# Patient Record
Sex: Female | Born: 1990 | Race: Asian | Hispanic: No | Marital: Married | Smoking: Never smoker
Health system: Southern US, Community
[De-identification: ages and names within clinical notes are randomized; demographics above are authoritative.]

## PROBLEM LIST (undated history)

## (undated) DIAGNOSIS — Z789 Other specified health status: Secondary | ICD-10-CM

## (undated) HISTORY — PX: NO PAST SURGERIES: SHX2092

---

## 2018-05-07 ENCOUNTER — Encounter (HOSPITAL_BASED_OUTPATIENT_CLINIC_OR_DEPARTMENT_OTHER): Payer: Self-pay

## 2018-05-07 ENCOUNTER — Emergency Department (HOSPITAL_BASED_OUTPATIENT_CLINIC_OR_DEPARTMENT_OTHER): Payer: 59

## 2018-05-07 ENCOUNTER — Emergency Department (HOSPITAL_BASED_OUTPATIENT_CLINIC_OR_DEPARTMENT_OTHER)
Admission: EM | Admit: 2018-05-07 | Discharge: 2018-05-07 | Disposition: A | Discharge status: Home / Self Care | Payer: 59 | Attending: Emergency Medicine | Admitting: Emergency Medicine

## 2018-05-07 ENCOUNTER — Other Ambulatory Visit: Payer: Self-pay

## 2018-05-07 DIAGNOSIS — R21 Rash and other nonspecific skin eruption: Secondary | ICD-10-CM | POA: Diagnosis not present

## 2018-05-07 DIAGNOSIS — R509 Fever, unspecified: Secondary | ICD-10-CM | POA: Diagnosis not present

## 2018-05-07 LAB — URINALYSIS, ROUTINE W REFLEX MICROSCOPIC
Bilirubin Urine: NEGATIVE
Glucose, UA: NEGATIVE mg/dL
Ketones, ur: 15 mg/dL — AB
Leukocytes, UA: NEGATIVE
Nitrite: NEGATIVE
Protein, ur: 30 mg/dL — AB
SPECIFIC GRAVITY, URINE: 1.02 (ref 1.005–1.030)
pH: 6 (ref 5.0–8.0)

## 2018-05-07 LAB — CBC WITH DIFFERENTIAL/PLATELET
Basophils Absolute: 0 10*3/uL (ref 0.0–0.1)
Basophils Relative: 0 %
Eosinophils Absolute: 0 10*3/uL (ref 0.0–0.7)
Eosinophils Relative: 0 %
HCT: 38.8 % (ref 36.0–46.0)
HEMOGLOBIN: 13.3 g/dL (ref 12.0–15.0)
LYMPHS ABS: 0.8 10*3/uL (ref 0.7–4.0)
LYMPHS PCT: 11 %
MCH: 28.9 pg (ref 26.0–34.0)
MCHC: 34.3 g/dL (ref 30.0–36.0)
MCV: 84.3 fL (ref 78.0–100.0)
Monocytes Absolute: 0.2 10*3/uL (ref 0.1–1.0)
Monocytes Relative: 3 %
NEUTROS ABS: 6.2 10*3/uL (ref 1.7–7.7)
Neutrophils Relative %: 86 %
Platelets: 119 10*3/uL — ABNORMAL LOW (ref 150–400)
RBC: 4.6 MIL/uL (ref 3.87–5.11)
RDW: 11.9 % (ref 11.5–15.5)
WBC: 7.3 10*3/uL (ref 4.0–10.5)

## 2018-05-07 LAB — COMPREHENSIVE METABOLIC PANEL
ALT: 20 U/L (ref 0–44)
AST: 26 U/L (ref 15–41)
Albumin: 3.8 g/dL (ref 3.5–5.0)
Alkaline Phosphatase: 44 U/L (ref 38–126)
Anion gap: 9 (ref 5–15)
BUN: 13 mg/dL (ref 6–20)
CHLORIDE: 100 mmol/L (ref 98–111)
CO2: 22 mmol/L (ref 22–32)
Calcium: 8.3 mg/dL — ABNORMAL LOW (ref 8.9–10.3)
Creatinine, Ser: 1.1 mg/dL — ABNORMAL HIGH (ref 0.44–1.00)
GFR calc Af Amer: >60 mL/min (ref >60)
Glucose, Bld: 126 mg/dL — ABNORMAL HIGH (ref 70–99)
POTASSIUM: 3.5 mmol/L (ref 3.5–5.1)
SODIUM: 131 mmol/L — AB (ref 135–145)
Total Bilirubin: 0.5 mg/dL (ref 0.3–1.2)
Total Protein: 7.3 g/dL (ref 6.5–8.1)

## 2018-05-07 LAB — PREGNANCY, URINE: PREG TEST UR: NEGATIVE

## 2018-05-07 LAB — URINALYSIS, MICROSCOPIC (REFLEX)

## 2018-05-07 LAB — I-STAT CG4 LACTIC ACID, ED: LACTIC ACID, VENOUS: 1.12 mmol/L (ref 0.5–1.9)

## 2018-05-07 MED ORDER — SODIUM CHLORIDE 0.9 % IV BOLUS
1000.0000 mL | Freq: Once | INTRAVENOUS | Status: AC
  Administered 2018-05-07: 1000 mL via INTRAVENOUS

## 2018-05-07 MED ORDER — ACETAMINOPHEN 325 MG PO TABS
650.0000 mg | ORAL_TABLET | Freq: Once | ORAL | Status: AC
  Administered 2018-05-07: 650 mg via ORAL
  Filled 2018-05-07: qty 2

## 2018-05-07 MED ORDER — DOXYCYCLINE HYCLATE 100 MG PO CAPS: 100.0000 mg | ORAL_CAPSULE | Freq: Two times a day (BID) | ORAL | 0 refills | Status: DC

## 2018-05-07 MED ORDER — DOXYCYCLINE HYCLATE 100 MG PO TABS
100.0000 mg | ORAL_TABLET | Freq: Once | ORAL | Status: AC
  Administered 2018-05-07: 100 mg via ORAL
  Filled 2018-05-07: qty 1

## 2018-05-07 NOTE — ED Notes (Signed)
Pt recent travel to [place] x 2 weeks ago

## 2018-05-07 NOTE — ED Notes (Signed)
ED Provider at bedside. 

## 2018-05-07 NOTE — ED Provider Notes (Signed)
[facility] EMERGENCY DEPARTMENT Provider Note   CSN: [id number] Arrival date & time: 05/07/18  1635     History   Chief Complaint Chief Complaint  Patient presents with  . Fever    HPI Carrie Harrington is a 27 y.o. female.  HPI  27 year old female presents with rash and fever.  She has no prior medical problems.  She recently came back from [place] where she was at theme parks.  She does not know of any obvious sick contacts.  Starting 3 days ago she developed some itching which developed into a small rash on her abdomen.  Since then the rash has spread and is now diffuse.  However it is not really present in her hands, feet and is starting to creep into her face.  She was given a cream at [facility] yesterday that she states is helping.  However the itching is gone but the rash is still present.  No oral lesions.  Since 2 nights ago she is been having fever up to 103.  Took Tylenol at about noon today.  No headache, neck pain, cough, chest pain, abdominal pain, vomiting or diarrhea.  Has not been vaccinated.  History reviewed. No pertinent past medical history.  There are no active problems to display for this patient.   History reviewed. No pertinent surgical history.   OB History   None      Home Medications    Prior to Admission medications   Not on File    Family History No family history on file.  Social History Social History   Tobacco Use  . Smoking status: Never Smoker  . Smokeless tobacco: Never Used  Substance Use Topics  . Alcohol use: Never    Frequency: Never  . Drug use: Not on file     Allergies   Patient has no known allergies.   Review of Systems Review of Systems  Constitutional: Positive for fatigue and fever.  HENT: Negative for sore throat.   Respiratory: Negative for cough and shortness of breath.   Cardiovascular: Negative for chest pain.  Gastrointestinal: Positive for nausea. Negative for  abdominal pain, diarrhea and vomiting.  Genitourinary: Negative for dysuria.  Musculoskeletal: Negative for neck pain.  Skin: Positive for rash.  Neurological: Negative for headaches.  All other systems reviewed and are negative.    Physical Exam Updated Vital Signs BP 104/64 (BP Location: Right Arm)   Pulse (!) 132   Temp (!) 101.9 F (38.8 C) (Oral)   Resp (!) 24   Ht 5\' 4"  (1.626 m)   Wt 46.7 kg (103 lb)   LMP 05/02/2018   SpO2 100%   BMI 17.68 kg/m   Physical Exam  Constitutional: She is oriented to person, place, and time. She appears well-developed and well-nourished. No distress.  HENT:  Head: Normocephalic and atraumatic.  Right Ear: External ear normal.  Left Ear: External ear normal.  Nose: Nose normal.  No intra-oral lesions, blisters or koplik spots  Eyes: Right eye exhibits no discharge. Left eye exhibits no discharge.  Neck: Normal range of motion. Neck supple.  Cardiovascular: Regular rhythm and normal heart sounds. Tachycardia present.  Pulmonary/Chest: Effort normal and breath sounds normal.  Abdominal: Soft. There is no tenderness.  Neurological: She is alert and oriented to person, place, and time.  Skin: Skin is warm and dry. Rash noted. Rash is maculopapular. She is not diaphoretic.  Diffuse, blanching maculopapular rash, mostly in the trunk and extremities  save for the hands and feet. Minimal on face  Nursing note and vitals reviewed.    ED Treatments / Results  Labs (all labs ordered are listed, but only abnormal results are displayed) Labs Reviewed  COMPREHENSIVE METABOLIC PANEL - Abnormal; Notable for the following components:      Result Value   Sodium 131 (*)    Glucose, Bld 126 (*)    Creatinine, Ser 1.10 (*)    Calcium 8.3 (*)    All other components within normal limits  CBC WITH DIFFERENTIAL/PLATELET - Abnormal; Notable for the following components:   Platelets 119 (*)    All other components within normal limits  URINALYSIS,  ROUTINE W REFLEX MICROSCOPIC - Abnormal; Notable for the following components:   Hgb urine dipstick TRACE (*)    Ketones, ur 15 (*)    Protein, ur 30 (*)    All other components within normal limits  URINALYSIS, MICROSCOPIC (REFLEX) - Abnormal; Notable for the following components:   Bacteria, UA MANY (*)    All other components within normal limits  CULTURE, BLOOD (ROUTINE X 2)  CULTURE, BLOOD (ROUTINE X 2)  PREGNANCY, URINE  RUBEOLA ANTIBODY IGG  RUBEOLA ANTIBODY, IGM  HIV ANTIBODY (ROUTINE TESTING)  HIV-1 RNA, QUALITATIVE, TMA  ROCKY MTN SPOTTED FVR ABS PNL(IGG+IGM)  I-STAT CG4 LACTIC ACID, ED    EKG None  Radiology Dg Chest Port 1 View  Result Date: 05/07/2018 CLINICAL DATA:  27 y/o  F; fever, fatigue, rash. EXAM: PORTABLE CHEST 1 VIEW COMPARISON:  None. FINDINGS: The heart size and mediastinal contours are within normal limits. Both lungs are clear. The visualized skeletal structures are unremarkable. IMPRESSION: No active disease. Electronically Signed   By: Mitzi Hansen M.D.   On: 05/07/2018 18:37    Procedures Procedures (including critical care time)  Medications Ordered in ED Medications  sodium chloride 0.9 % bolus 1,000 mL (has no administration in time range)  acetaminophen (TYLENOL) tablet 650 mg (has no administration in time range)     Initial Impression / Assessment and Plan / ED Course  I have reviewed the triage vital signs and the nursing notes.  Pertinent labs & imaging results that were available during my care of the patient were reviewed by me and considered in my medical decision making (see chart for details).     Besides the tachycardia and fever, the patient is actually pretty well-appearing.  She has no complaints besides the rash and the fever.  The rash was itchy but is not now.  She has been traveling recently and is not up-to-date on vaccinations.  No obvious other source of infection.  No urinary symptoms.  I discussed with ID  on-call, Dr. Orvan Falconer.  This presentation is atypical for measles given no other symptoms and only the rash and fever.  She does not have typical cough, coryza, and does not have Koplik spots.  He does have a recommend sending measles titers with IgM and IgG.  Also recommends covering for RMSF with doxycycline for 5 days as well as sending HIV antibody and viral load.  Discussed she should stay at home and essentially quarantine herself.  Discussed return precautions but I see no need for her to be admitted as she is otherwise well-appearing, her vital signs have normalized, and she does not appear to have any organ injury.  Final Clinical Impressions(s) / ED Diagnoses   Final diagnoses:  Fever in adult  Rash and nonspecific skin eruption    ED  Discharge Orders    None       Pricilla Loveless, MD 05/08/18 0025

## 2018-05-07 NOTE — ED Notes (Signed)
Pt and pt husband informed to stay at home until test results were given- Pt given several mask to wear when in public and this RN strictly enforced to wear mask until heard otherwise. Pt given discharge instructions and prescription. Pt and pt husband voiced understanding and have no concerns at this time.

## 2018-05-07 NOTE — ED Notes (Signed)
Pt placed on airborne precautions.

## 2018-05-07 NOTE — ED Notes (Signed)
Spoke with Dr. Ilsa IhaSnyder- Infectious Disease to provide timeline of patient recent travel per pt family

## 2018-05-07 NOTE — ED Triage Notes (Addendum)
Pt had a rash that started on her trunk on Sunday and has spread to entire body with fever and fatigue, was seen [facility] yesterday and not given a diagnosis, was told to go to the ED if she got worse, last had 500mg  tylenol at noon, states the rash itches and that her vaccines are up to date

## 2018-05-07 NOTE — ED Notes (Signed)
Spoke with Diannia RuderKara of infection prevention [facility]. Will contact health depart and review chart.

## 2018-05-07 NOTE — ED Notes (Signed)
Carrie Harrington from ID called and stated that the PCR testing is not necessary, just the [facility] and ICM, and pt should stay in her home for the next few days while she is getting better.  She needs to wear a mask if she goes in public.

## 2018-05-07 NOTE — Discharge Instructions (Addendum)
For now, stay in your house and drink plenty of fluids and use ibuprofen and or Tylenol for fever.  If you develop worsening symptoms such as headache, vomiting, shortness of breath, or other new/concerning symptoms and return to the ER or see her primary care doctor at [facility].  Otherwise take the antibiotics prescribed until completion.

## 2018-05-10 LAB — ROCKY MTN SPOTTED FVR ABS PNL(IGG+IGM)
RMSF IgG: NEGATIVE
RMSF IgM: 0.23 index (ref 0.00–0.89)

## 2018-05-10 LAB — HIV ANTIBODY (ROUTINE TESTING W REFLEX): HIV Screen 4th Generation wRfx: NONREACTIVE

## 2018-05-11 LAB — HIV-1 RNA, QUALITATIVE, TMA: HIV-1 RNA, Qualitative, TMA: NEGATIVE

## 2018-05-12 LAB — CULTURE, BLOOD (ROUTINE X 2)
CULTURE: NO GROWTH
Culture: NO GROWTH
Special Requests: ADEQUATE

## 2018-05-13 LAB — RUBEOLA ANTIBODY IGG: Rubeola IgG: <25 AU/mL — ABNORMAL LOW (ref >29.9)

## 2018-05-13 LAB — RUBEOLA ANTIBODY, IGM

## 2019-01-15 IMAGING — DX DG CHEST 1V PORT
1 series · 1 of 1 positions shown · non-contrast
Comparison: None.

CLINICAL DATA: 27 y/o  F; fever, fatigue, rash.

EXAM:
PORTABLE CHEST 1 VIEW

[chest ap]
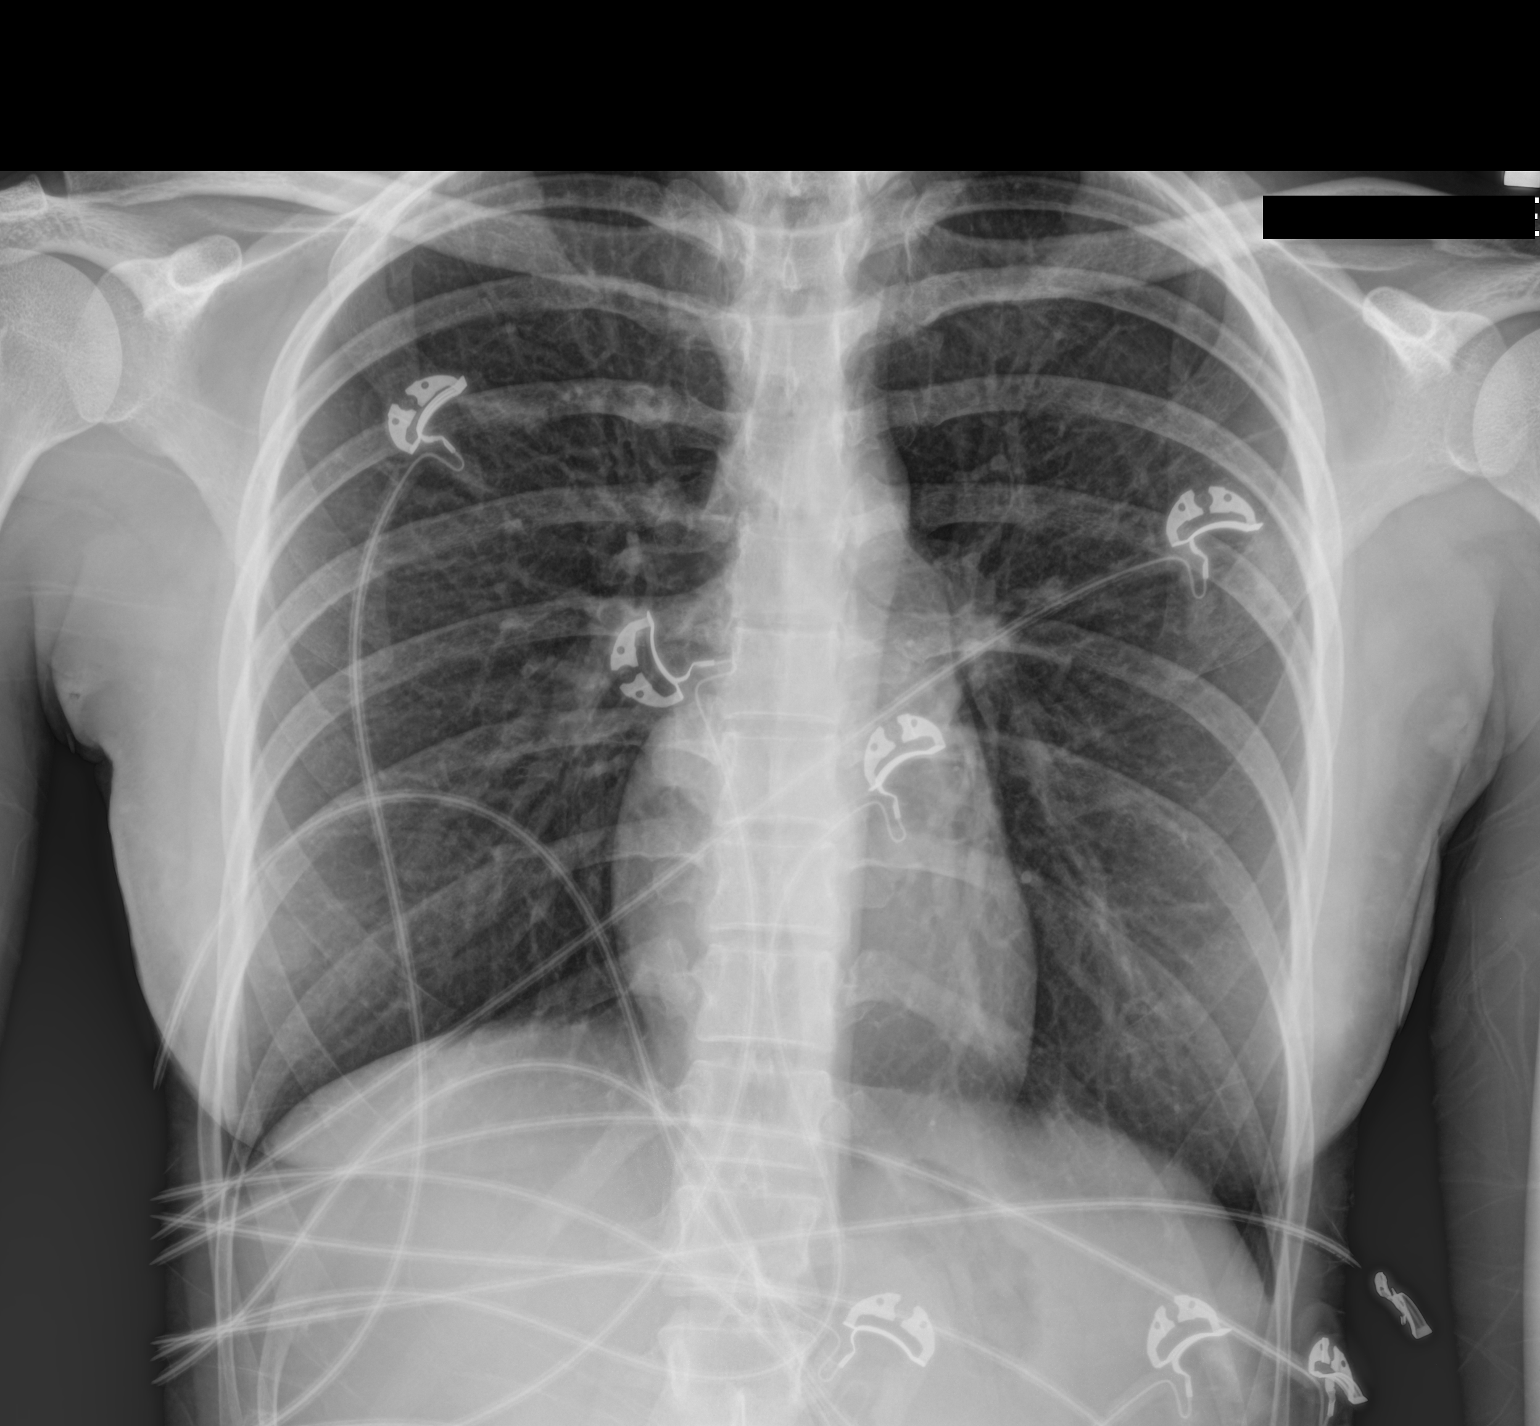

[1 of 1 positions shown; findings below may reference images not displayed]

FINDINGS: The heart size and mediastinal contours are within normal limits.
Both lungs are clear. The visualized skeletal structures are
unremarkable.
IMPRESSION: No active disease.

By: Baraya Bacha M.D.

## 2020-03-16 LAB — OB RESULTS CONSOLE GC/CHLAMYDIA
Chlamydia: NEGATIVE
Gonorrhea: NEGATIVE

## 2020-03-16 LAB — OB RESULTS CONSOLE RUBELLA ANTIBODY, IGM: Rubella: IMMUNE

## 2020-03-16 LAB — OB RESULTS CONSOLE RPR: RPR: NONREACTIVE

## 2020-03-16 LAB — OB RESULTS CONSOLE HEPATITIS B SURFACE ANTIGEN: Hepatitis B Surface Ag: NEGATIVE

## 2020-03-16 LAB — OB RESULTS CONSOLE HIV ANTIBODY (ROUTINE TESTING): HIV: NONREACTIVE

## 2020-09-16 LAB — OB RESULTS CONSOLE GBS: GBS: NEGATIVE

## 2020-10-08 ENCOUNTER — Other Ambulatory Visit: Payer: Self-pay

## 2020-10-08 ENCOUNTER — Inpatient Hospital Stay (EMERGENCY_DEPARTMENT_HOSPITAL)
Admission: AD | Admit: 2020-10-08 | Discharge: 2020-10-08 | Disposition: A | Discharge status: Home / Self Care | Payer: 59 | Source: Home / Self Care | Attending: Obstetrics and Gynecology | Admitting: Obstetrics and Gynecology

## 2020-10-08 ENCOUNTER — Encounter (HOSPITAL_COMMUNITY): Payer: Self-pay | Admitting: Obstetrics and Gynecology

## 2020-10-08 DIAGNOSIS — Z3A39 39 weeks gestation of pregnancy: Secondary | ICD-10-CM | POA: Diagnosis not present

## 2020-10-08 DIAGNOSIS — O471 False labor at or after 37 completed weeks of gestation: Secondary | ICD-10-CM | POA: Insufficient documentation

## 2020-10-08 HISTORY — DX: Other specified health status: Z78.9

## 2020-10-08 NOTE — Discharge Instructions (Signed)
Braxton Hicks Contractions °Contractions of the uterus can occur throughout pregnancy, but they are not always a sign that you are in labor. You may have practice contractions called Braxton Hicks contractions. These false labor contractions are sometimes confused with true labor. °What are Braxton Hicks contractions? °Braxton Hicks contractions are tightening movements that occur in the muscles of the uterus before labor. Unlike true labor contractions, these contractions do not result in opening (dilation) and thinning of the cervix. Toward the end of pregnancy (32-34 weeks), Braxton Hicks contractions can happen more often and may become stronger. These contractions are sometimes difficult to tell apart from true labor because they can be very uncomfortable. You should not feel embarrassed if you go to the hospital with false labor. °Sometimes, the only way to tell if you are in true labor is for your health care provider to look for changes in the cervix. The health care provider will do a physical exam and may monitor your contractions. If you are not in true labor, the exam should show that your cervix is not dilating and your water has not broken. °If there are no other health problems associated with your pregnancy, it is completely safe for you to be sent home with false labor. You may continue to have Braxton Hicks contractions until you go into true labor. °How to tell the difference between true labor and false labor °True labor °· Contractions last 30-70 seconds. °· Contractions become very regular. °· Discomfort is usually felt in the top of the uterus, and it spreads to the lower abdomen and low back. °· Contractions do not go away with walking. °· Contractions usually become more intense and increase in frequency. °· The cervix dilates and gets thinner. °False labor °· Contractions are usually shorter and not as strong as true labor contractions. °· Contractions are usually irregular. °· Contractions  are often felt in the front of the lower abdomen and in the groin. °· Contractions may go away when you walk around or change positions while lying down. °· Contractions get weaker and are shorter-lasting as time goes on. °· The cervix usually does not dilate or become thin. °Follow these instructions at home: ° °· Take over-the-counter and prescription medicines only as told by your health care provider. °· Keep up with your usual exercises and follow other instructions from your health care provider. °· Eat and drink lightly if you think you are going into labor. °· If Braxton Hicks contractions are making you uncomfortable: °? Change your position from lying down or resting to walking, or change from walking to resting. °? Sit and rest in a tub of warm water. °? Drink enough fluid to keep your urine pale yellow. Dehydration may cause these contractions. °? Do slow and deep breathing several times an hour. °· Keep all follow-up prenatal visits as told by your health care provider. This is important. °Contact a health care provider if: °· You have a fever. °· You have continuous pain in your abdomen. °Get help right away if: °· Your contractions become stronger, more regular, and closer together. °· You have fluid leaking or gushing from your vagina. °· You pass blood-tinged mucus (bloody show). °· You have bleeding from your vagina. °· You have low back pain that you never had before. °· You feel your baby’s head pushing down and causing pelvic pressure. °· Your baby is not moving inside you as much as it used to. °Summary °· Contractions that occur before labor are   called Braxton Hicks contractions, false labor, or practice contractions. °· Braxton Hicks contractions are usually shorter, weaker, farther apart, and less regular than true labor contractions. True labor contractions usually become progressively stronger and regular, and they become more frequent. °· Manage discomfort from Braxton Hicks contractions  by changing position, resting in a warm bath, drinking plenty of water, or practicing deep breathing. °This information is not intended to replace advice given to you by your health care provider. Make sure you discuss any questions you have with your health care provider. °Document Revised: 09/27/2017 Document Reviewed: 02/28/2017 °Elsevier Patient Education © 2020 [employer] ° °

## 2020-10-08 NOTE — MAU Note (Signed)
Pt reports to mau with c/o ctx q 10 min since last night.  Pt reports she had her membranes swept yesterday in office.  Denies LOF but reports some bloody show.  +FM

## 2020-10-08 NOTE — MAU Provider Note (Signed)
S: Ms. Gwenyth Dingee is a 29 y.o. G1P0 at [redacted]w[redacted]d  who presents to MAU today complaining contractions q 10 minutes since this morning, but they have eased since she got in the car to come here. She denies vaginal bleeding. She endorses LOF. She reports normal fetal movement.    O: BP 121/85 (BP Location: Right Arm)   Pulse 97   Temp 97.7 F (36.5 C) (Oral)   Resp 16   SpO2 99%  GENERAL: Well-developed, well-nourished female in no acute distress.  HEAD: Normocephalic, atraumatic.  CHEST: Normal effort of breathing, regular heart rate ABDOMEN: Soft, nontender, gravid  Cervical exam (unchanged from her last office visit):  Dilation: 3 Effacement (%): 50 Cervical Position: Posterior Station: Ballotable Presentation: Vertex Exam by:: Shanda Bumps  Fetal Monitoring: reactive Baseline: 140 Variability: moderate Accelerations: present Decelerations: none Contractions: irregular >47min apart  A: SIUP at [redacted]w[redacted]d  False labor  P: Discharge to home in stable condition with term labor precautions Follow up at [facility] as scheduled  Bernerd Limbo, CNM 10/08/2020 5:40 PM

## 2020-10-09 ENCOUNTER — Inpatient Hospital Stay (HOSPITAL_COMMUNITY)
Admission: AD | Admit: 2020-10-09 | Discharge: 2020-10-12 | DRG: 788 | Disposition: A | Discharge status: Home / Self Care | Payer: 59 | Attending: Obstetrics | Admitting: Obstetrics

## 2020-10-09 ENCOUNTER — Other Ambulatory Visit: Payer: Self-pay

## 2020-10-09 ENCOUNTER — Inpatient Hospital Stay (HOSPITAL_COMMUNITY): Payer: 59 | Admitting: Anesthesiology

## 2020-10-09 ENCOUNTER — Encounter (HOSPITAL_COMMUNITY): Payer: Self-pay | Admitting: Obstetrics and Gynecology

## 2020-10-09 DIAGNOSIS — Z3A40 40 weeks gestation of pregnancy: Secondary | ICD-10-CM

## 2020-10-09 DIAGNOSIS — O411230 Chorioamnionitis, third trimester, not applicable or unspecified: Secondary | ICD-10-CM | POA: Diagnosis present

## 2020-10-09 DIAGNOSIS — Z20822 Contact with and (suspected) exposure to covid-19: Secondary | ICD-10-CM | POA: Diagnosis present

## 2020-10-09 DIAGNOSIS — O411290 Chorioamnionitis, unspecified trimester, not applicable or unspecified: Secondary | ICD-10-CM | POA: Diagnosis not present

## 2020-10-09 DIAGNOSIS — O26893 Other specified pregnancy related conditions, third trimester: Secondary | ICD-10-CM | POA: Diagnosis present

## 2020-10-09 LAB — CBC
HCT: 42.5 % (ref 36.0–46.0)
Hemoglobin: 13 g/dL (ref 12.0–15.0)
MCH: 26.7 pg (ref 26.0–34.0)
MCHC: 30.6 g/dL (ref 30.0–36.0)
MCV: 87.3 fL (ref 80.0–100.0)
Platelets: 280 10*3/uL (ref 150–400)
RBC: 4.87 MIL/uL (ref 3.87–5.11)
RDW: 15.9 % — ABNORMAL HIGH (ref 11.5–15.5)
WBC: 13.9 10*3/uL — ABNORMAL HIGH (ref 4.0–10.5)
nRBC: 0 % (ref 0.0–0.2)

## 2020-10-09 LAB — RESP PANEL BY RT-PCR (FLU A&B, COVID) ARPGX2
Influenza A by PCR: NEGATIVE
Influenza B by PCR: NEGATIVE
SARS Coronavirus 2 by RT PCR: NEGATIVE

## 2020-10-09 LAB — TYPE AND SCREEN
ABO/RH(D): B POS
Antibody Screen: NEGATIVE

## 2020-10-09 MED ORDER — SOD CITRATE-CITRIC ACID 500-334 MG/5ML PO SOLN
30.0000 mL | ORAL | Status: DC | PRN
  Administered 2020-10-10: 30 mL via ORAL
  Filled 2020-10-09: qty 15

## 2020-10-09 MED ORDER — ACETAMINOPHEN 325 MG PO TABS: 650.0000 mg | ORAL_TABLET | ORAL | Status: DC | PRN

## 2020-10-09 MED ORDER — FENTANYL-BUPIVACAINE-NACL 0.5-0.125-0.9 MG/250ML-% EP SOLN
12.0000 mL/h | EPIDURAL | Status: DC | PRN
  Filled 2020-10-09: qty 250

## 2020-10-09 MED ORDER — OXYTOCIN-SODIUM CHLORIDE 30-0.9 UT/500ML-% IV SOLN
1.0000 m[IU]/min | INTRAVENOUS | Status: DC
  Administered 2020-10-09: 13:00:00 2 m[IU]/min via INTRAVENOUS
  Filled 2020-10-09: qty 500

## 2020-10-09 MED ORDER — OXYCODONE-ACETAMINOPHEN 5-325 MG PO TABS: 2.0000 | ORAL_TABLET | ORAL | Status: DC | PRN

## 2020-10-09 MED ORDER — DIPHENHYDRAMINE HCL 50 MG/ML IJ SOLN: 12.5000 mg | INTRAMUSCULAR | Status: DC | PRN

## 2020-10-09 MED ORDER — SODIUM CHLORIDE 0.9 % IV SOLN
2.0000 g | Freq: Once | INTRAVENOUS | Status: AC
  Administered 2020-10-09: 23:00:00 2 g via INTRAVENOUS
  Filled 2020-10-09: qty 2000

## 2020-10-09 MED ORDER — TERBUTALINE SULFATE 1 MG/ML IJ SOLN: 0.2500 mg | Freq: Once | INTRAMUSCULAR | Status: DC | PRN

## 2020-10-09 MED ORDER — ACETAMINOPHEN 500 MG PO TABS
1000.0000 mg | ORAL_TABLET | Freq: Once | ORAL | Status: AC
  Administered 2020-10-09: 23:00:00 1000 mg via ORAL
  Filled 2020-10-09: qty 2

## 2020-10-09 MED ORDER — LACTATED RINGERS IV SOLN: 500.0000 mL | Freq: Once | INTRAVENOUS | Status: DC

## 2020-10-09 MED ORDER — PHENYLEPHRINE 40 MCG/ML (10ML) SYRINGE FOR IV PUSH (FOR BLOOD PRESSURE SUPPORT): 80.0000 ug | PREFILLED_SYRINGE | INTRAVENOUS | Status: DC | PRN

## 2020-10-09 MED ORDER — OXYTOCIN-SODIUM CHLORIDE 30-0.9 UT/500ML-% IV SOLN: 2.5000 [IU]/h | INTRAVENOUS | Status: DC

## 2020-10-09 MED ORDER — LACTATED RINGERS IV SOLN
INTRAVENOUS | Status: DC
  Administered 2020-10-09: 125 mL via INTRAVENOUS

## 2020-10-09 MED ORDER — EPHEDRINE 5 MG/ML INJ: 10.0000 mg | INTRAVENOUS | Status: DC | PRN

## 2020-10-09 MED ORDER — SODIUM CHLORIDE (PF) 0.9 % IJ SOLN
INTRAMUSCULAR | Status: DC | PRN
  Administered 2020-10-09: 12 mL/h via EPIDURAL

## 2020-10-09 MED ORDER — LIDOCAINE HCL (PF) 1 % IJ SOLN
INTRAMUSCULAR | Status: DC | PRN
  Administered 2020-10-09: 2 mL via EPIDURAL
  Administered 2020-10-09: 3 mL via EPIDURAL
  Administered 2020-10-09: 5 mL via EPIDURAL

## 2020-10-09 MED ORDER — ONDANSETRON HCL 4 MG/2ML IJ SOLN: 4.0000 mg | Freq: Four times a day (QID) | INTRAMUSCULAR | Status: DC | PRN

## 2020-10-09 MED ORDER — OXYTOCIN BOLUS FROM INFUSION: 333.0000 mL | Freq: Once | INTRAVENOUS | Status: DC

## 2020-10-09 MED ORDER — LIDOCAINE HCL (PF) 1 % IJ SOLN: 30.0000 mL | INTRAMUSCULAR | Status: DC | PRN

## 2020-10-09 MED ORDER — OXYCODONE-ACETAMINOPHEN 5-325 MG PO TABS: 1.0000 | ORAL_TABLET | ORAL | Status: DC | PRN

## 2020-10-09 MED ORDER — LACTATED RINGERS IV SOLN
500.0000 mL | INTRAVENOUS | Status: DC | PRN
  Administered 2020-10-09: 500 mL via INTRAVENOUS

## 2020-10-09 MED ORDER — FLEET ENEMA 7-19 GM/118ML RE ENEM: 1.0000 | ENEMA | RECTAL | Status: DC | PRN

## 2020-10-09 MED ORDER — GENTAMICIN SULFATE 40 MG/ML IJ SOLN
5.0000 mg/kg | Freq: Once | INTRAVENOUS | Status: AC
  Administered 2020-10-09: 340 mg via INTRAVENOUS
  Filled 2020-10-09: qty 8.5

## 2020-10-09 NOTE — Anesthesia Procedure Notes (Signed)
Epidural Patient location during procedure: OB Start time: 10/09/2020 12:23 PM End time: 10/09/2020 12:30 PM  Staffing Anesthesiologist: Cecile Hearing, MD Performed: anesthesiologist   Preanesthetic Checklist Completed: patient identified, IV checked, risks and benefits discussed, monitors and equipment checked, pre-op evaluation and timeout performed  Epidural Patient position: sitting Prep: DuraPrep Patient monitoring: blood pressure and continuous pulse ox Approach: midline Location: L3-L4 Injection technique: LOR air  Needle:  Needle type: Tuohy  Needle gauge: 17 G Needle length: 9 cm Needle insertion depth: 5 cm Catheter size: 19 Gauge Catheter at skin depth: 10 cm Test dose: negative and Other (1% Lidocaine)  Additional Notes Patient identified.  Risk benefits discussed including failed block, incomplete pain control, headache, nerve damage, paralysis, blood pressure changes, nausea, vomiting, reactions to medication both toxic or allergic, and postpartum back pain.  Patient expressed understanding and wished to proceed.  All questions were answered.  Sterile technique used throughout procedure and epidural site dressed with sterile barrier dressing. No paresthesia or other complications noted. The patient did not experience any signs of intravascular injection such as tinnitus or metallic taste in mouth nor signs of intrathecal spread such as rapid motor block. Please see nursing notes for vital signs. Reason for block:procedure for pain

## 2020-10-09 NOTE — Anesthesia Preprocedure Evaluation (Signed)
Anesthesia Evaluation  Patient identified by MRN, date of birth, ID band Patient awake    Reviewed: Allergy & Precautions, NPO status , Patient's Chart, lab work & pertinent test results  Airway Mallampati: II  TM Distance: >3 FB Neck ROM: Full    Dental  (+) Teeth Intact, Dental Advisory Given   Pulmonary neg pulmonary ROS,    Pulmonary exam normal breath sounds clear to auscultation       Cardiovascular negative cardio ROS Normal cardiovascular exam Rhythm:Regular Rate:Normal     Neuro/Psych negative neurological ROS     GI/Hepatic negative GI ROS, Neg liver ROS,   Endo/Other  negative endocrine ROS  Renal/GU negative Renal ROS     Musculoskeletal negative musculoskeletal ROS (+)   Abdominal   Peds  Hematology negative hematology ROS (+) Plt 280k   Anesthesia Other Findings Day of surgery medications reviewed with the patient.  Reproductive/Obstetrics (+) Pregnancy                             Anesthesia Physical Anesthesia Plan  ASA: II  Anesthesia Plan: Epidural   Post-op Pain Management:    Induction:   PONV Risk Score and Plan: 2 and Treatment may vary due to age or medical condition  Airway Management Planned: Natural Airway  Additional Equipment:   Intra-op Plan:   Post-operative Plan:   Informed Consent: I have reviewed the patients History and Physical, chart, labs and discussed the procedure including the risks, benefits and alternatives for the proposed anesthesia with the patient or authorized representative who has indicated his/her understanding and acceptance.       Plan Discussed with:   Anesthesia Plan Comments: (Patient identified. Risks/Benefits/Options discussed with patient including but not limited to bleeding, infection, nerve damage, paralysis, failed block, incomplete pain control, headache, blood pressure changes, nausea, vomiting, reactions to  medication both or allergic, itching and postpartum back pain. Confirmed with bedside nurse the patient's most recent platelet count. Confirmed with patient that they are not currently taking any anticoagulation, have any bleeding history or any family history of bleeding disorders. Patient expressed understanding and wished to proceed. All questions were answered. )        Anesthesia Quick Evaluation

## 2020-10-09 NOTE — H&P (Signed)
OB ADMISSION/ HISTORY & PHYSICAL:  Admission Date: 10/09/2020  9:31 AM  Admit Diagnosis: Normal labor [O80, Z37.9]    Carrie Harrington is a 29 y.o. female presenting for labor contractions. Seen last night in MAU 3cm. Ctx overnight and no sleep, stronger since 6 AM. No LOF/VB, good FM. Denies HA/NV/RUQ pain/visual changes.   Spouse present and supportive.  Expecting baby boy, declines circ.   Prenatal History: G1P0   EDC : 10/09/2020, by Other Basis  Prenatal care at [facility] since 10 wks.   Prenatal course complicated by: Pruritus of soles at 37 wks, bile salts 8.4, AST/ALT 24/33  Prenatal Labs: ABO, Rh:   B pos Antibody: neg Rubella:   imm RPR:   neg HBsAg:   neg HIV:   neg GBS:   neg 1 hr Glucola : 114 Genetic Screening: AFP1 wnl Ultrasound: nl anatomy XY, AGA  Vaccines: TDaP          UTD         Flu             UTD                    COVID-19 UTD    Maternal Diabetes: No Genetic Screening: Normal Maternal Ultrasounds/Referrals: Normal Fetal Ultrasounds or other Referrals:  None Maternal Substance Abuse:  No Significant Maternal Medications:  None Significant Maternal Lab Results:  Group B Strep negative Other Comments:  None  Medical / Surgical History :  Past medical history:  Past Medical History:  Diagnosis Date  . Medical history non-contributory      Past surgical history:  Past Surgical History:  Procedure Laterality Date  . NO PAST SURGERIES       Family History: History reviewed. No pertinent family history.   Social History:  reports that she has never smoked. She has never used smokeless tobacco. She reports that she does not drink alcohol and does not use drugs.   Allergies: Patient has no known allergies.   Current Medications at time of admission:  Medications Prior to Admission  Medication Sig Dispense Refill Last Dose  . ferrous sulfate 325 (65 FE) MG tablet Take 325 mg by mouth every other day.    10/08/2020 at 1000  . Prenatal Vit-Fe Fumarate-FA (PRENATAL VITAMINS PO) Take 1 tablet by mouth daily.   10/08/2020 at 1000  . doxycycline (VIBRAMYCIN) 100 MG capsule Take 1 capsule (100 mg total) by mouth 2 (two) times daily. 10 capsule 0 More than a month at Unknown time     Review of Systems: ROS As noted above Physical Exam: Vital signs and nursing notes reviewed.  Patient Vitals for the past 24 hrs:  BP Temp Temp src Pulse Resp SpO2 Height Weight  10/09/20 1151 117/82 98.2 F (36.8 C) Oral 92 18 -- 5\' 4"  (1.626 m) 68 kg  10/09/20 1000 114/83 97.8 F (36.6 C) Oral (!) 110 16 98 % -- --     General: AAO x 3, NAD, coping well, desires epidural Heart: RRR Lungs:CTAB Abdomen: Gravid, NT Extremities: no edema Genitalia / VE: Dilation: 5 Effacement (%): 100 Cervical Position: Posterior Station: -1 Presentation: Vertex Exam by:: D. Azam Gervasi, CNM  IBOW  FHR: 130 BPM, mod variability, + accels, no decels TOCO: Ctx q 5-8, mod to palp  Labs:   Pending T&S, CBC, RPR  Recent Labs    10/09/20 1110  WBC 13.9*  HGB 13.0  HCT 42.5  PLT 280  Assessment:  29 y.o. G1P0 at [redacted]w[redacted]d  1. Suboptimal ctx and protracted early stage 2. FHR category 1 3. GBS neg 4. Desires epidural, plans to breastfeed 5. Breastfeeding 6. Placenta disposal per patient request  Plan:  1. Admit to BS 2. Routine L&D orders 3. Analgesia/anesthesia PRN  4. Pitocin augmentation after patient comfortable w/ epidural 5. Anticipate NSVB   Dr Billy Coast notified of admission / plan of care   Neta Mends CNM, MSN 10/09/2020, 12:23 PM

## 2020-10-09 NOTE — MAU Note (Signed)
Pt reports to mau with c/o ctx q 3 min since last night.  Denies LOF.

## 2020-10-09 NOTE — Progress Notes (Signed)
S: Doing well, no complaints, pain well controlled with epidural  O: BP 107/63   Pulse 98   Temp 98.1 F (36.7 C) (Oral)   Resp 18   Ht 5\' 4"  (1.626 m)   Wt 68 kg   SpO2 98%   BMI 25.75 kg/m    FHT:  FHR: 140s bpm, variability: moderate,  accelerations:  Present,  decelerations:  Absent UC:   regular, every 2-4 minutes, pit at 12 munits/ min SVE:   Dilation: 5 Effacement (%): 100 Station: -1 Exam by:: [email or web address], CNM  Repeat exam now by me: 8/90%/vtx 0 AROM clear with bloody mucous  A / P:  29 y.o.  OB History  Gravida Para Term Preterm AB Living  1 0 0 0 0 0  SAB IAB Ectopic Multiple Live Births  0 0 0 0 0   at [redacted]w[redacted]d Augmentation of labor, progressing well  Fetal Wellbeing:  Category I Pain Control:  Epidural  Anticipated MOD:  NSVD  [redacted]w[redacted]d 10/09/2020, 6:44 PM

## 2020-10-10 ENCOUNTER — Encounter (HOSPITAL_COMMUNITY): Payer: Self-pay | Admitting: Obstetrics and Gynecology

## 2020-10-10 ENCOUNTER — Encounter (HOSPITAL_COMMUNITY)
Admission: AD | Disposition: A | Discharge status: Home / Self Care | Payer: Self-pay | Source: Home / Self Care | Attending: Obstetrics

## 2020-10-10 DIAGNOSIS — O411290 Chorioamnionitis, unspecified trimester, not applicable or unspecified: Secondary | ICD-10-CM | POA: Diagnosis not present

## 2020-10-10 LAB — CBC
HCT: 34.2 % — ABNORMAL LOW (ref 36.0–46.0)
Hemoglobin: 10.8 g/dL — ABNORMAL LOW (ref 12.0–15.0)
MCH: 26.8 pg (ref 26.0–34.0)
MCHC: 31.6 g/dL (ref 30.0–36.0)
MCV: 84.9 fL (ref 80.0–100.0)
Platelets: 218 10*3/uL (ref 150–400)
RBC: 4.03 MIL/uL (ref 3.87–5.11)
RDW: 15.8 % — ABNORMAL HIGH (ref 11.5–15.5)
WBC: 20.8 10*3/uL — ABNORMAL HIGH (ref 4.0–10.5)
nRBC: 0 % (ref 0.0–0.2)

## 2020-10-10 LAB — RPR: RPR Ser Ql: NONREACTIVE

## 2020-10-10 SURGERY — Surgical Case
Anesthesia: Epidural

## 2020-10-10 MED ORDER — DIBUCAINE (PERIANAL) 1 % EX OINT: 1.0000 "application " | TOPICAL_OINTMENT | CUTANEOUS | Status: DC | PRN

## 2020-10-10 MED ORDER — DIPHENHYDRAMINE HCL 50 MG/ML IJ SOLN: 12.5000 mg | INTRAMUSCULAR | Status: DC | PRN

## 2020-10-10 MED ORDER — COCONUT OIL OIL: 1.0000 "application " | TOPICAL_OIL | Status: DC | PRN

## 2020-10-10 MED ORDER — ONDANSETRON HCL 4 MG/2ML IJ SOLN
INTRAMUSCULAR | Status: AC
  Filled 2020-10-10: qty 2

## 2020-10-10 MED ORDER — DIPHENHYDRAMINE HCL 25 MG PO CAPS: 25.0000 mg | ORAL_CAPSULE | Freq: Four times a day (QID) | ORAL | Status: DC | PRN

## 2020-10-10 MED ORDER — SODIUM CHLORIDE 0.9% FLUSH: 3.0000 mL | INTRAVENOUS | Status: DC | PRN

## 2020-10-10 MED ORDER — WITCH HAZEL-GLYCERIN EX PADS
1.0000 "application " | MEDICATED_PAD | CUTANEOUS | Status: DC | PRN
  Filled 2020-10-10: qty 100

## 2020-10-10 MED ORDER — TETANUS-DIPHTH-ACELL PERTUSSIS 5-2.5-18.5 LF-MCG/0.5 IM SUSY: 0.5000 mL | PREFILLED_SYRINGE | Freq: Once | INTRAMUSCULAR | Status: DC

## 2020-10-10 MED ORDER — OXYTOCIN-SODIUM CHLORIDE 30-0.9 UT/500ML-% IV SOLN
INTRAVENOUS | Status: AC
  Filled 2020-10-10: qty 500

## 2020-10-10 MED ORDER — SCOPOLAMINE 1 MG/3DAYS TD PT72: 1.0000 | MEDICATED_PATCH | Freq: Once | TRANSDERMAL | Status: DC

## 2020-10-10 MED ORDER — NALBUPHINE HCL 10 MG/ML IJ SOLN: 5.0000 mg | Freq: Once | INTRAMUSCULAR | Status: DC | PRN

## 2020-10-10 MED ORDER — NALOXONE HCL 4 MG/10ML IJ SOLN
1.0000 ug/kg/h | INTRAVENOUS | Status: DC | PRN
  Filled 2020-10-10: qty 5

## 2020-10-10 MED ORDER — HYDROMORPHONE HCL 1 MG/ML IJ SOLN: 0.2500 mg | INTRAMUSCULAR | Status: DC | PRN

## 2020-10-10 MED ORDER — NALBUPHINE HCL 10 MG/ML IJ SOLN: 5.0000 mg | INTRAMUSCULAR | Status: DC | PRN

## 2020-10-10 MED ORDER — MEPERIDINE HCL 25 MG/ML IJ SOLN: 6.2500 mg | INTRAMUSCULAR | Status: DC | PRN

## 2020-10-10 MED ORDER — OXYTOCIN-SODIUM CHLORIDE 30-0.9 UT/500ML-% IV SOLN
2.5000 [IU]/h | INTRAVENOUS | Status: AC
  Administered 2020-10-10: 05:00:00 2.5 [IU]/h via INTRAVENOUS
  Filled 2020-10-10: qty 500

## 2020-10-10 MED ORDER — PRENATAL MULTIVITAMIN CH
1.0000 | ORAL_TABLET | Freq: Every day | ORAL | Status: DC
  Administered 2020-10-10 – 2020-10-12 (×3): 1 via ORAL
  Filled 2020-10-10 (×3): qty 1

## 2020-10-10 MED ORDER — NALOXONE HCL 0.4 MG/ML IJ SOLN: 0.4000 mg | INTRAMUSCULAR | Status: DC | PRN

## 2020-10-10 MED ORDER — ACETAMINOPHEN 500 MG PO TABS: 1000.0000 mg | ORAL_TABLET | Freq: Four times a day (QID) | ORAL | Status: DC

## 2020-10-10 MED ORDER — MEPERIDINE HCL 25 MG/ML IJ SOLN
6.2500 mg | INTRAMUSCULAR | Status: DC | PRN
Start: 2020-10-10 — End: 2020-10-10

## 2020-10-10 MED ORDER — DIPHENHYDRAMINE HCL 25 MG PO CAPS: 25.0000 mg | ORAL_CAPSULE | ORAL | Status: DC | PRN

## 2020-10-10 MED ORDER — IBUPROFEN 800 MG PO TABS
800.0000 mg | ORAL_TABLET | Freq: Three times a day (TID) | ORAL | Status: DC
  Administered 2020-10-10 – 2020-10-12 (×6): 800 mg via ORAL
  Filled 2020-10-10 (×6): qty 1

## 2020-10-10 MED ORDER — ZOLPIDEM TARTRATE 5 MG PO TABS: 5.0000 mg | ORAL_TABLET | Freq: Every evening | ORAL | Status: DC | PRN

## 2020-10-10 MED ORDER — GENTAMICIN SULFATE 40 MG/ML IJ SOLN: 1.5000 mg/kg | Freq: Three times a day (TID) | INTRAVENOUS | Status: DC

## 2020-10-10 MED ORDER — SIMETHICONE 80 MG PO CHEW
80.0000 mg | CHEWABLE_TABLET | Freq: Three times a day (TID) | ORAL | Status: DC
  Administered 2020-10-10 – 2020-10-12 (×7): 80 mg via ORAL
  Filled 2020-10-10 (×7): qty 1

## 2020-10-10 MED ORDER — ACETAMINOPHEN 500 MG PO TABS
1000.0000 mg | ORAL_TABLET | Freq: Four times a day (QID) | ORAL | Status: DC
  Administered 2020-10-10 – 2020-10-12 (×9): 1000 mg via ORAL
  Filled 2020-10-10 (×10): qty 2

## 2020-10-10 MED ORDER — LACTATED RINGERS IV SOLN: INTRAVENOUS | Status: DC | PRN

## 2020-10-10 MED ORDER — KETOROLAC TROMETHAMINE 30 MG/ML IJ SOLN: 30.0000 mg | Freq: Once | INTRAMUSCULAR | Status: DC | PRN

## 2020-10-10 MED ORDER — NALBUPHINE HCL 10 MG/ML IJ SOLN
5.0000 mg | INTRAMUSCULAR | Status: DC | PRN
Start: 2020-10-10 — End: 2020-10-12

## 2020-10-10 MED ORDER — DEXAMETHASONE SODIUM PHOSPHATE 4 MG/ML IJ SOLN
INTRAMUSCULAR | Status: DC | PRN
  Administered 2020-10-10: 4 mg via INTRAVENOUS

## 2020-10-10 MED ORDER — KETOROLAC TROMETHAMINE 30 MG/ML IJ SOLN: 30.0000 mg | Freq: Four times a day (QID) | INTRAMUSCULAR | Status: AC | PRN

## 2020-10-10 MED ORDER — SIMETHICONE 80 MG PO CHEW: 80.0000 mg | CHEWABLE_TABLET | ORAL | Status: DC | PRN

## 2020-10-10 MED ORDER — DEXAMETHASONE SODIUM PHOSPHATE 4 MG/ML IJ SOLN
INTRAMUSCULAR | Status: AC
  Filled 2020-10-10: qty 1

## 2020-10-10 MED ORDER — OXYCODONE HCL 5 MG PO TABS: 5.0000 mg | ORAL_TABLET | ORAL | Status: DC | PRN

## 2020-10-10 MED ORDER — PROMETHAZINE HCL 25 MG/ML IJ SOLN
6.2500 mg | INTRAMUSCULAR | Status: DC | PRN
Start: 2020-10-10 — End: 2020-10-10

## 2020-10-10 MED ORDER — MENTHOL 3 MG MT LOZG: 1.0000 | LOZENGE | OROMUCOSAL | Status: DC | PRN

## 2020-10-10 MED ORDER — MEPERIDINE HCL 25 MG/ML IJ SOLN
INTRAMUSCULAR | Status: AC
  Filled 2020-10-10: qty 1

## 2020-10-10 MED ORDER — OXYTOCIN-SODIUM CHLORIDE 30-0.9 UT/500ML-% IV SOLN
INTRAVENOUS | Status: DC | PRN
  Administered 2020-10-10: 400 mL via INTRAVENOUS

## 2020-10-10 MED ORDER — ONDANSETRON HCL 4 MG/2ML IJ SOLN
INTRAMUSCULAR | Status: DC | PRN
  Administered 2020-10-10: 4 mg via INTRAVENOUS

## 2020-10-10 MED ORDER — SODIUM CHLORIDE 0.9 % IR SOLN
Status: DC | PRN
  Administered 2020-10-10: 1000 mL

## 2020-10-10 MED ORDER — ONDANSETRON HCL 4 MG/2ML IJ SOLN: 4.0000 mg | Freq: Three times a day (TID) | INTRAMUSCULAR | Status: DC | PRN

## 2020-10-10 MED ORDER — SENNOSIDES-DOCUSATE SODIUM 8.6-50 MG PO TABS
2.0000 | ORAL_TABLET | ORAL | Status: DC
  Administered 2020-10-10 – 2020-10-12 (×3): 2 via ORAL
  Filled 2020-10-10 (×3): qty 2

## 2020-10-10 MED ORDER — LIDOCAINE-EPINEPHRINE (PF) 2 %-1:200000 IJ SOLN
INTRAMUSCULAR | Status: DC | PRN
  Administered 2020-10-10: 2 mL via EPIDURAL
  Administered 2020-10-10: 5 mL via EPIDURAL
  Administered 2020-10-10: 3 mL via EPIDURAL

## 2020-10-10 MED ORDER — MORPHINE SULFATE (PF) 0.5 MG/ML IJ SOLN
INTRAMUSCULAR | Status: AC
  Filled 2020-10-10: qty 10

## 2020-10-10 MED ORDER — MEPERIDINE HCL 25 MG/ML IJ SOLN
INTRAMUSCULAR | Status: DC | PRN
  Administered 2020-10-10: 12.5 mg via INTRAVENOUS

## 2020-10-10 MED ORDER — MORPHINE SULFATE (PF) 0.5 MG/ML IJ SOLN
INTRAMUSCULAR | Status: DC | PRN
  Administered 2020-10-10: 3 mg via EPIDURAL

## 2020-10-10 MED ORDER — LIDOCAINE HCL (PF) 2 % IJ SOLN
INTRAMUSCULAR | Status: AC
  Filled 2020-10-10: qty 10

## 2020-10-10 MED ORDER — CLINDAMYCIN PHOSPHATE 900 MG/50ML IV SOLN
900.0000 mg | Freq: Three times a day (TID) | INTRAVENOUS | Status: AC
  Administered 2020-10-10 – 2020-10-11 (×3): 900 mg via INTRAVENOUS
  Filled 2020-10-10 (×4): qty 50

## 2020-10-10 MED ORDER — SIMETHICONE 80 MG PO CHEW: 80.0000 mg | CHEWABLE_TABLET | ORAL | Status: DC

## 2020-10-10 MED ORDER — STERILE WATER FOR IRRIGATION IR SOLN
Status: DC | PRN
  Administered 2020-10-10: 1000 mL

## 2020-10-10 SURGICAL SUPPLY — 36 items
BENZOIN TINCTURE PRP APPL 2/3 (GAUZE/BANDAGES/DRESSINGS) ×3 IMPLANT
CHLORAPREP W/TINT 26ML (MISCELLANEOUS) ×3 IMPLANT
CLAMP CORD UMBIL (MISCELLANEOUS) IMPLANT
CLOSURE WOUND 1/2 X4 (GAUZE/BANDAGES/DRESSINGS) ×1
CLOTH BEACON ORANGE TIMEOUT ST (SAFETY) ×3 IMPLANT
DRSG OPSITE POSTOP 4X10 (GAUZE/BANDAGES/DRESSINGS) ×3 IMPLANT
ELECT REM PT RETURN 9FT ADLT (ELECTROSURGICAL) ×3
ELECTRODE REM PT RTRN 9FT ADLT (ELECTROSURGICAL) ×1 IMPLANT
EXTRACTOR VACUUM M CUP 4 TUBE (SUCTIONS) IMPLANT
EXTRACTOR VACUUM M CUP 4' TUBE (SUCTIONS)
GLOVE BIO SURGEON STRL SZ 6.5 (GLOVE) ×2 IMPLANT
GLOVE BIO SURGEONS STRL SZ 6.5 (GLOVE) ×1
GLOVE BIOGEL PI IND STRL 7.0 (GLOVE) ×2 IMPLANT
GLOVE BIOGEL PI INDICATOR 7.0 (GLOVE) ×4
GOWN STRL REUS W/TWL LRG LVL3 (GOWN DISPOSABLE) ×6 IMPLANT
HEMOSTAT ARISTA ABSORB 3G PWDR (HEMOSTASIS) ×3 IMPLANT
KIT ABG SYR 3ML LUER SLIP (SYRINGE) IMPLANT
NEEDLE HYPO 22GX1.5 SAFETY (NEEDLE) IMPLANT
NEEDLE HYPO 25X5/8 SAFETYGLIDE (NEEDLE) IMPLANT
NS IRRIG 1000ML POUR BTL (IV SOLUTION) ×3 IMPLANT
PACK C SECTION WH (CUSTOM PROCEDURE TRAY) ×3 IMPLANT
PAD OB MATERNITY 4.3X12.25 (PERSONAL CARE ITEMS) ×3 IMPLANT
PENCIL SMOKE EVAC W/HOLSTER (ELECTROSURGICAL) ×3 IMPLANT
STRIP CLOSURE SKIN 1/2X4 (GAUZE/BANDAGES/DRESSINGS) ×2 IMPLANT
SUT MON AB 4-0 PS1 27 (SUTURE) ×3 IMPLANT
SUT PLAIN 0 NONE (SUTURE) IMPLANT
SUT PLAIN 2 0 XLH (SUTURE) IMPLANT
SUT VIC AB 0 CT1 36 (SUTURE) ×6 IMPLANT
SUT VIC AB 0 CTX 36 (SUTURE) ×4
SUT VIC AB 0 CTX36XBRD ANBCTRL (SUTURE) ×2 IMPLANT
SUT VIC AB 2-0 CT1 27 (SUTURE) ×2
SUT VIC AB 2-0 CT1 TAPERPNT 27 (SUTURE) ×1 IMPLANT
SYR CONTROL 10ML LL (SYRINGE) IMPLANT
TOWEL OR 17X24 6PK STRL BLUE (TOWEL DISPOSABLE) ×3 IMPLANT
TRAY FOLEY W/BAG SLVR 14FR LF (SET/KITS/TRAYS/PACK) IMPLANT
WATER STERILE IRR 1000ML POUR (IV SOLUTION) ×3 IMPLANT

## 2020-10-10 NOTE — Transfer of Care (Signed)
Immediate Anesthesia Transfer of Care Note  Patient: Carrie Harrington  Procedure(s) Performed: CESAREAN SECTION (N/A )  Patient Location: PACU  Anesthesia Type:Epidural  Level of Consciousness: awake, alert  and patient cooperative  Airway & Oxygen Therapy: Patient Spontanous Breathing  Post-op Assessment: Report given to RN and Post -op Vital signs reviewed and stable  Post vital signs: Reviewed and stable  Last Vitals:  Vitals Value Taken Time  BP 118/72 10/10/20 0233  Temp    Pulse 95 10/10/20 0237  Resp 19 10/10/20 0237  SpO2 100 % 10/10/20 0237  Vitals shown include unvalidated device data.  Last Pain:  Vitals:   10/10/20 0002  TempSrc: Oral  PainSc:          Complications: No complications documented.

## 2020-10-10 NOTE — Anesthesia Postprocedure Evaluation (Signed)
Anesthesia Post Note  Patient: Carrie Harrington  Procedure(s) Performed: CESAREAN SECTION (N/A )     Patient location during evaluation: PACU Anesthesia Type: Epidural Level of consciousness: awake Pain management: pain level controlled Vital Signs Assessment: post-procedure vital signs reviewed and stable Respiratory status: spontaneous breathing Cardiovascular status: stable Postop Assessment: no headache, no backache, epidural receding, patient able to bend at knees and no apparent nausea or vomiting Anesthetic complications: no   No complications documented.  Last Vitals:  Vitals:   10/10/20 0315 10/10/20 0330  BP: 109/80 101/87  Pulse: 93 97  Resp: 17 15  Temp:    SpO2: 99% 99%    Last Pain:  Vitals:   10/10/20 0245  TempSrc: Oral  PainSc: 0-No pain   Pain Goal:                Epidural/Spinal Function Cutaneous sensation: Able to Wiggle Toes (10/10/20 0315), Patient able to flex knees: No (10/10/20 0315), Patient able to lift hips off bed: No (10/10/20 0315), Back pain beyond tenderness at insertion site: No (10/10/20 0315), Progressively worsening motor and/or sensory loss: No (10/10/20 0315), Bowel and/or bladder incontinence post epidural: No (10/10/20 0315)  Caren Macadam

## 2020-10-10 NOTE — Lactation Note (Signed)
This note was copied from a baby's chart. Lactation Consultation Note  Patient Name: Carrie Harrington CBSWH'Q Date: 10/10/2020 Reason for consult: Follow-up assessment   P1 mother whose infant is now 74 hours old.  This is a term baby at 40+0 weeks.  RN requested assistance.  Baby was STS on mother's chest when I arrived.  Mother stated the RN assisted and baby fed for approximately 15 minutes.  Reviewed breast feeding basics with mother.  She is familiar with feeding cues and hand expression and has been able to express colostrum.  Mother will continue to feed on cue and call for latch assistance as needed.    Mother has a DEBP for home use.  Father and family member present.     Maternal Data    Feeding Feeding Type: Breast Fed  LATCH Score Latch: Repeated attempts needed to sustain latch, nipple held in mouth throughout feeding, stimulation needed to elicit sucking reflex.  Audible Swallowing: A few with stimulation  Type of Nipple: Everted at rest and after stimulation  Comfort (Breast/Nipple): Soft / non-tender  Hold (Positioning): Assistance needed to correctly position infant at breast and maintain latch.  LATCH Score: 7  Interventions    Lactation Tools Discussed/Used     Consult Status Consult Status: Follow-up Date: 10/11/20 Follow-up type: In-patient    Dora Sims 10/10/2020, 3:18 PM

## 2020-10-10 NOTE — Brief Op Note (Signed)
10/09/2020 - 10/10/2020  2:20 AM  PATIENT:  Carrie Harrington  29 y.o. female  PRE-OPERATIVE DIAGNOSIS:  arrest of descent, chorioamnionitis  POST-OPERATIVE DIAGNOSIS:  arrest of descent, chorioamnionitis, nuchal cord, thick meconium  PROCEDURE:  Procedure(s): CESAREAN SECTION (N/A)  Primary low transverse cesarean section with 2 layer closure  SURGEON:  Surgeon(s) and Role:    * Noland Fordyce, MD - Primary  PHYSICIAN ASSISTANT: Surgical tech  ASSISTANTS: As above  ANESTHESIA:   epidural  EBL: Per operative nursing notes  BLOOD ADMINISTERED:none  DRAINS: Urinary Catheter (Foley)   LOCAL MEDICATIONS USED:  NONE  SPECIMEN:  Source of Specimen:  Placenta  DISPOSITION OF SPECIMEN:  To labor and delivery for disposal  COUNTS:  YES  TOURNIQUET:  * No tourniquets in log *  DICTATION: .Note written in EPIC  PLAN OF CARE: Admit to inpatient   PATIENT DISPOSITION:  PACU - hemodynamically stable.   Delay start of Pharmacological VTE agent (>24hrs) due to surgical blood loss or risk of bleeding: yes

## 2020-10-10 NOTE — Lactation Note (Signed)
This note was copied from a baby's chart. Lactation Consultation Note  Patient Name: Carrie Harrington GYKZL'D Date: 10/10/2020 Reason for consult: Follow-up assessment  LC Follow Up Visit:  Attempted to visit with mother, however, she was asleep.  Will return later today.   Maternal Data    Feeding Feeding Type: Breast Milk  LATCH Score                   Interventions    Lactation Tools Discussed/Used     Consult Status Consult Status: Follow-up Date: 10/10/20 Follow-up type: In-patient    [name] 10/10/2020, 11:57 AM

## 2020-10-10 NOTE — Op Note (Signed)
10/09/2020 - 10/10/2020  2:20 AM  PATIENT:  Carrie Harrington  29 y.o. female  PRE-OPERATIVE DIAGNOSIS:  arrest of descent, chorioamnionitis  POST-OPERATIVE DIAGNOSIS:  arrest of descent, chorioamnionitis, nuchal cord, thick meconium  PROCEDURE:  Procedure(s): CESAREAN SECTION (N/A)  Primary low transverse cesarean section with 2 layer closure  SURGEON:  Surgeon(s) and Role:    * Noland Fordyce, MD - Primary  PHYSICIAN ASSISTANT: Surgical tech  ASSISTANTS: As above  ANESTHESIA:   epidural  EBL: Per operative nursing notes  BLOOD ADMINISTERED:none  DRAINS: Urinary Catheter (Foley)   LOCAL MEDICATIONS USED:  NONE  SPECIMEN:  Source of Specimen:  Placenta  DISPOSITION OF SPECIMEN:  To labor and delivery for disposal  COUNTS:  YES  TOURNIQUET:  * No tourniquets in log *  DICTATION: .Note written in EPIC  PLAN OF CARE: Admit to inpatient   PATIENT DISPOSITION:  PACU - hemodynamically stable.   Delay start of Pharmacological VTE agent (>24hrs) due to surgical blood loss or risk of bleeding: yes   Findings:  @BABYSEXEBC @ infant,  APGAR (1 MIN):   APGAR (5 MINS):   APGAR (10 MINS):   Normal uterus, tubes and ovaries, normal placenta. 3VC, clear amniotic fluid  EBL: Per nursing notes cc Antibiotics: Ampicillin and gentamicin given prior to C-section.  Clindamycin now and continue gent and Clinda 24 hours  Complications: none  Indications: This is a 29 y.o. year-old, G1 at [redacted]w[redacted]d admitted for active labor movement needed augmentation after she stalled at 5 cm.  Patient made adequate progression to 10 cm after Pitocin and artificial rupture of membranes.  She was allowed 45 minutes of passive descent and began pushing from the 0 station.  Patient pushed in various positions but may no further descent after 3 hours of pushing past the  0 station.  Decision was made to proceed with cesarean section.. Risks benefits and alternatives of the procedure were discussed with  the patient who agreed to proceed  Procedure:  After informed consent was obtained the patient was taken to the operating room where epidural anesthesia was found to be adequate.  She was prepped and draped in the normal sterile fashion in dorsal supine position with a leftward tilt.  A foley catheter was in place.  A Pfannenstiel skin incision was made 2 cm above the pubic symphysis in the midline with the scalpel.  Dissection was carried down with the Bovie cautery until the fascia was reached. The fascia was incised in the midline. The incision was extended laterally with the Mayo scissors. The inferior aspect of the fascial incision was grasped with the Coker clamps, elevated up and the underlying rectus muscles were dissected off sharply. The superior aspect of the fascial incision was grasped with the Coker clamps elevated up and the underlying rectus muscles were dissected off sharply.  The peritoneum was entered bluntly. The peritoneal incision was extended superiorly and inferiorly with good visualization of the bladder. The bladder blade was inserted and palpation was done to assess the fetal position and the location of the uterine vessels.  A bladder flap was created sharply.  The baby was felt to be quite low in the pelvis a nurse placed her hand in the trying to elevate the fetal head cephalad.  The lower segment of the uterus was incised sharply with the scalpel and extended  bluntly in the cephalo-caudal fashion. The infant was grasped, brought to the incision, nuchal cord x1 was reduced rotated and the infant was delivered with  fundal pressure.  Copious amounts of meconium dark and thick was noted.  Spontaneous cry. .The cord was clamped and cut after 1 minute delay. The infant was handed off to the waiting pediatrician. The placenta was expressed. The uterus was exteriorized. The uterus was cleared of all clots and debris. The uterine incision was repaired with 0 Vicryl in a running locked  fashion.  A second layer of the same suture was used in an imbricating fashion to obtain excellent hemostasis.  Some oozing was noted from the right and left anterior surface of the uterus. This was controlled pressure, cautery and eventual use of Arista.  The uterus was then returned to the abdomen, the gutters were cleared of all clots and debris. The uterine incision was reinspected and found to be hemostatic. The peritoneum was grasped and closed with 2-0 Vicryl in a running fashion. The cut muscle edges and the underside of the fascia were inspected and found to be hemostatic. The fascia was closed with 0 Vicryl in a single layer . The subcutaneous tissue was irrigated. Scarpa's layer was closed with a 2-0 plain gut suture. The skin was closed with a 4-0 Monocryl in a single layer. The patient tolerated the procedure well. Sponge lap and needle counts were correct x3 and patient was taken to the recovery room in a stable condition.  Lendon Colonel 10/10/2020 2:23 AM

## 2020-10-10 NOTE — Lactation Note (Addendum)
This note was copied from a baby's chart. Lactation Consultation Note Baby 4 hrs old. Baby and FOB sleeping. Mom sleepy but d/t itching having a hard time sleeping but is very tired. Asked mom if LC could talk about BF, mom stated sure.  Hand expression taught. Colostrum easily expressed. Collected a few drops in colostrum container. Left spoon at bedside. Mom had positive breast changes during pregnancy.  Newborn feeding habits, behavior, STS, importance of I&O, milk storage, supply and demand discussed. Mom encouraged to feed baby 8-12 times/24 hours and with feeding cues.   Mom would like LC to come back when baby is going to feed. Encouraged mom to call for assistance if needed. Brochure given. Encouraged mom to call for assistance or questions.  Lactation brochure given. Lactation will return when mom is more alert for teaching.   Patient Name: Carrie Harrington [id number]'V Date: 10/10/2020 Reason for consult: Initial assessment;Primapara;Term   Maternal Data Has patient been taught Hand Expression?: Yes Does the patient have breastfeeding experience prior to this delivery?: No  Feeding Feeding Type: Breast Fed  LATCH Score Latch: Grasps breast easily, tongue down, lips flanged, rhythmical sucking.  Audible Swallowing: A few with stimulation  Type of Nipple: Everted at rest and after stimulation  Comfort (Breast/Nipple): Soft / non-tender  Hold (Positioning): Assistance needed to correctly position infant at breast and maintain latch.  LATCH Score: 8  Interventions Interventions: Breast massage;Hand express;Breast compression  Lactation Tools Discussed/Used WIC Program: No   Consult Status Consult Status: Follow-up Date: 10/10/20 Follow-up type: In-patient    Charyl Dancer 10/10/2020, 6:10 AM

## 2020-10-10 NOTE — Lactation Note (Signed)
This note was copied from a baby's chart. Lactation Consultation Note  Patient Name: Carrie Harrington Cly WKGSU'P Date: 10/10/2020 Reason for consult: Follow-up assessment;Mother's request;Difficult latch   Infant is 40 weeks 18 hours old. LC called by RN to assist with a latch. On arrival Mom had infant in cradle position on the right breast. Infant was not tummy to tummy and alignment was off. Infant was also in an outfit.   LC assisted Mom to de-latch infant. No compression stripe noted. Infant outfit removed, placed STS in football on the left side. Mom denied any pain and signs of milk transfer noted during the feeding. Mom also taught hand expression and Dad able to feed droplets of colostrum collected before infant latched again.   Plan 1. To feed based on cues 8-12x in 24 hour period no more than 4 hours without an attempt.           2. Mom to stimulate the breast with hand expression and can spoon feed 5 ml or more to infant if unable to latch.          3. LC reviewed tracking feedings on the I and O sheet.

## 2020-10-11 NOTE — Lactation Note (Signed)
This note was copied from a baby's chart. Lactation Consultation Note  Patient Name: Carrie Harrington EXMDY'J Date: 10/11/2020 Reason for consult: Follow-up assessment   Baby 31 hours old.  Mother in shower.  Spoke with FOB regarding baby falling asleep at the breast. Suggest family call Lactation for assistance with next feeding.    Maternal Data    Feeding Feeding Type: Breast Fed  LATCH Score Latch: Grasps breast easily, tongue down, lips flanged, rhythmical sucking.  Audible Swallowing: Spontaneous and intermittent  Type of Nipple: Everted at rest and after stimulation  Comfort (Breast/Nipple): Soft / non-tender  Hold (Positioning): Assistance needed to correctly position infant at breast and maintain latch.  LATCH Score: 9  Interventions    Lactation Tools Discussed/Used     Consult Status Consult Status: Follow-up Date: 10/11/20 Follow-up type: In-patient    Carrie Harrington [facility] 10/11/2020, 9:35 AM

## 2020-10-11 NOTE — Progress Notes (Signed)
Subjective: POD# 2 Live born female  Birth Weight: 7 lb 1.9 oz (3230 g) APGAR: 8, 8  Newborn Delivery   Birth date/time: 10/10/2020 01:35:00 Delivery type: C-Section, Low Transverse Trial of labor: Yes C-section categorization: Primary     Baby name: Carrie Harrington Delivering provider: Noland Fordyce   Circumcision Declines Feeding: breast and bottle  Pain control at delivery: Epidural   Reports feeling well with minimal pain. Up to the bathroom and voiding. Mother at the bedside. Desires discharge today but baby is being monitored for jaundice.  Patient reports tolerating PO.   Pain controlled with ibuprofen (OTC) and narcotic analgesics including Oxy IR Denies HA/SOB/C/P/N/V/dizziness. Flatus yes. She reports vaginal bleeding as normal, without clots. She is ambulating and urinating without difficulty.     Objective:  Vitals:   10/10/20 1630 10/10/20 2100 10/11/20 0140 10/11/20 0506  BP: (!) 94/59 (!) 88/60 90/60 93/64   Pulse: 97 81 80 89  Resp: 16 18 16 16   Temp: (!) 97.5 F (36.4 C) (!) 97.5 F (36.4 C) 97.6 F (36.4 C) 97.6 F (36.4 C)  TempSrc: Oral Oral Oral Oral  SpO2: 99%     Weight:      Height:         Intake/Output Summary (Last 24 hours) at 10/11/2020 1153 Last data filed at 10/10/2020 1745 Gross per 24 hour  Intake --  Output 900 ml  Net -900 ml      Recent Labs    10/09/20 1110 10/10/20 0517  WBC 13.9* 20.8*  HGB 13.0 10.8*  HCT 42.5 34.2*  PLT 280 218     Blood type: --/--/B POS (12/12 1110)  Rubella: Immune (05/19 0000)   Vaccines:   TDaP          UTD           Flu             UTD                      COVID-19 UTD x 3  Physical Exam:  General: alert and cooperative CV: Regular rate and rhythm Resp: clear Abdomen: soft, nontender, normal bowel sounds Incision: dry, intact and bloody drainage present Uterine Fundus: firm, below umbilicus, nontender Lochia: minimal and moderate Ext: extremities normal, atraumatic, no cyanosis or edema  and no edema, redness or tenderness in the calves or thighs  Assessment/Plan: 29 y.o.   POD# 2. G1P1001                  Principal Problem:   Postpartum care following cesarean delivery 12/13  Encourage rest when baby rests  Breastfeeding support prn  Encourage to ambulate  Routine post-op care  Nursing to change dressing today Active Problems:   Normal labor   Cesarean delivery - Arrest of descent, chorio   Chorioamnionitis  WBC 20.8 on POD #1  Afebrile   Anticipate discharge tomorrow.             26, CNM, MSN 10/11/2020, 11:53 AM

## 2020-10-12 MED ORDER — ACETAMINOPHEN 500 MG PO TABS: 1000.0000 mg | ORAL_TABLET | Freq: Four times a day (QID) | ORAL | 0 refills | Status: AC

## 2020-10-12 MED ORDER — IBUPROFEN 800 MG PO TABS: 800.0000 mg | ORAL_TABLET | Freq: Three times a day (TID) | ORAL | 0 refills | Status: AC

## 2020-10-12 MED ORDER — OXYCODONE HCL 5 MG PO TABS: 5.0000 mg | ORAL_TABLET | ORAL | 0 refills | Status: AC | PRN

## 2020-10-12 NOTE — Discharge Summary (Signed)
OB Discharge Summary  Patient Name: Carrie Harrington DOB: 06/02/1991 MRN: [id number]  Date of admission: 10/09/2020 Delivering provider: Noland Fordyce   Admitting diagnosis: Normal labor [O80, Z37.9] Intrauterine pregnancy: [redacted]w[redacted]d     Secondary diagnosis: Patient Active Problem List   Diagnosis Date Noted  . Cesarean delivery - Arrest of descent, chorio 10/10/2020  . Postpartum care following cesarean delivery 12/13 10/10/2020  . Chorioamnionitis 10/10/2020  . Normal labor 10/09/2020     Date of discharge: 10/12/2020   Discharge diagnosis: Principal Problem:   Postpartum care following cesarean delivery 12/13 Active Problems:   Normal labor   Cesarean delivery - Arrest of descent, chorio   Chorioamnionitis                                                        Post partum procedures:none  Augmentation: AROM and Pitocin Pain control: Epidural  Laceration:None  Episiotomy:None  Complications: None  Hospital course:  Induction of Labor With Cesarean Section   29 y.o. yo G1P1001 at [redacted]w[redacted]d was admitted to the hospital 10/09/2020 for induction of labor. Patient had a labor course significant for chorioamnionitis and arrest of descent. The patient went for cesarean section due to Arrest of Descent. Delivery details are as follows: Membrane Rupture Time/Date: 6:40 PM ,10/09/2020   Delivery Method:C-Section, Low Transverse  Details of operation can be found in separate operative Note.  Patient had an uncomplicated postpartum course. She is ambulating, tolerating a regular diet, passing flatus, and urinating well.  Patient is discharged home in stable condition on 10/13/20.      Newborn Data: Birth date:10/10/2020  Birth time:1:35 AM  Gender:Female  Living status:Living  Apgars:8 ,8  Weight:3230 g                                 Physical exam  Vitals:   10/11/20 0506 10/11/20 1500 10/11/20 2121 10/12/20 0516  BP: 93/64 104/68 115/79 107/74  Pulse: 89 99 86 90  Resp: 16 16  17 18   Temp: 97.6 F (36.4 C) 97.7 F (36.5 C) 98.3 F (36.8 C) (!) 97.5 F (36.4 C)  TempSrc: Oral Oral Oral Oral  SpO2:   100%   Weight:      Height:       General: alert, cooperative and no distress Lochia: appropriate Uterine Fundus: firm Incision: Healing well with no significant drainage, Dressing is clean, dry, and intact DVT Evaluation: No evidence of DVT seen on physical exam. No significant calf/ankle edema. Labs: Lab Results  Component Value Date   WBC 20.8 (H) 10/10/2020   HGB 10.8 (L) 10/10/2020   HCT 34.2 (L) 10/10/2020   MCV 84.9 10/10/2020   PLT 218 10/10/2020   CMP Latest Ref Rng & Units 05/07/2018  Glucose 70 - 99 mg/dL 07/08/2018)  BUN 6 - 20 mg/dL 13  Creatinine 245(Y - 0.99 mg/dL 8.33)  Sodium 8.25(K - 539 mmol/L 131(L)  Potassium 3.5 - 5.1 mmol/L 3.5  Chloride 98 - 111 mmol/L 100  CO2 22 - 32 mmol/L 22  Calcium 8.9 - 10.3 mg/dL 8.3(L)  Total Protein 6.5 - 8.1 g/dL 7.3  Total Bilirubin 0.3 - 1.2 mg/dL 0.5  Alkaline Phos 38 - 126 U/L 44  AST 15 - 41 U/L 26  ALT 0 - 44 U/L 20   Edinburgh Postnatal Depression Scale Screening Tool 10/10/2020  I have been able to laugh and see the funny side of things. 0  I have looked forward with enjoyment to things. 0  I have blamed myself unnecessarily when things went wrong. 1  I have been anxious or worried for no good reason. 0  I have felt scared or panicky for no good reason. 0  Things have been getting on top of me. 2  I have been so unhappy that I have had difficulty sleeping. 0  I have felt sad or miserable. 1  I have been so unhappy that I have been crying. 1  The thought of harming myself has occurred to me. 0  Edinburgh Postnatal Depression Scale Total 5    Vaccines: TDaP UTD         Flu    UTD         COVID-19   UTD  Discharge instructions:  per After Visit Summary and [facility] OB booklet  After Visit Meds:  Allergies as of 10/12/2020   No Known Allergies     Medication List    STOP taking  these medications   doxycycline 100 MG capsule Commonly known as: VIBRAMYCIN   ferrous sulfate 325 (65 FE) MG tablet     TAKE these medications   acetaminophen 500 MG tablet Commonly known as: TYLENOL Take 2 tablets (1,000 mg total) by mouth every 6 (six) hours.   ibuprofen 800 MG tablet Commonly known as: ADVIL Take 1 tablet (800 mg total) by mouth every 8 (eight) hours.   oxyCODONE 5 MG immediate release tablet Commonly known as: Oxy IR/ROXICODONE Take 1-2 tablets (5-10 mg total) by mouth every 4 (four) hours as needed for moderate pain.   PRENATAL VITAMINS PO Take 1 tablet by mouth daily.       Diet: routine diet  Activity: Advance as tolerated. Pelvic rest for 6 weeks.   Newborn Data: Live born female  Birth Weight: 7 lb 1.9 oz (3230 g) APGAR: 8, 8  Newborn Delivery   Birth date/time: 10/10/2020 01:35:00 Delivery type: C-Section, Low Transverse Trial of labor: Yes C-section categorization: Primary       Named Devarsh Baby Feeding: Bottle and Breast Disposition:home with mother  Delivery Report:  Review the Delivery Report for details.    Follow up:  Follow-up Information    Shea Evans, MD. Schedule an appointment as soon as possible for a visit in 6 week(s).   Specialty: Obstetrics and Gynecology Contact information: [address] [place] [address] [phone]               Clancy Gourd, MSN 10/12/2020, 11:44 AM

## 2020-10-12 NOTE — Lactation Note (Addendum)
This note was copied from a baby's chart. Lactation Consultation Note  Patient Name: Boy Emalene Welte XMIWO'E Date: 10/12/2020 Reason for consult: Follow-up assessment;1st time breastfeeding   P1, Baby 55 hours old and latched upon entering with intermittent swallows.  Baby bf for 25 min. 8.98% weight loss.  2 voids/3 stools in the last 24 hours. Stools green, weight stabilizing. Mother inquired about pumping and storing breastmilk during the night so she can sleep. Discussed supply and demand and suggest she wake after 4 hours and pump to maintain her milk supply. Reviewed milk storage guidelines and suggest waiting until at least 3 weeks to store breastmilk. For now give volume that is pumped back to baby especially if she is sleepy at the breast.  Mother has DEBP at home.  Reviewed engorgement care and monitoring voids/stools.    Maternal Data    Feeding Feeding Type: Breast Fed (latched upon entering)  LATCH Score Latch: Grasps breast easily, tongue down, lips flanged, rhythmical sucking.  Audible Swallowing: A few with stimulation  Type of Nipple: Everted at rest and after stimulation  Comfort (Breast/Nipple): Soft / non-tender  Hold (Positioning): No assistance needed to correctly position infant at breast.  LATCH Score: 9  Interventions Interventions: Breast feeding basics reviewed  Lactation Tools Discussed/Used     Consult Status Consult Status: Complete Date: 10/12/20    Dahlia Byes [facility] 10/12/2020, 9:04 AM
